# Patient Record
Sex: Female | Born: 1960 | Race: White | Hispanic: No | Marital: Married | Smoking: Never smoker
Health system: Southern US, Community
[De-identification: ages and names within clinical notes are randomized; demographics above are authoritative.]

## PROBLEM LIST (undated history)

## (undated) DIAGNOSIS — R000 Tachycardia, unspecified: Secondary | ICD-10-CM

## (undated) DIAGNOSIS — M1990 Unspecified osteoarthritis, unspecified site: Secondary | ICD-10-CM

## (undated) DIAGNOSIS — I82409 Acute embolism and thrombosis of unspecified deep veins of unspecified lower extremity: Secondary | ICD-10-CM

## (undated) DIAGNOSIS — J45909 Unspecified asthma, uncomplicated: Secondary | ICD-10-CM

## (undated) HISTORY — PX: ABDOMINAL HYSTERECTOMY: SHX81

## (undated) HISTORY — PX: BUNIONECTOMY: SHX129

## (undated) HISTORY — PX: TONSILLECTOMY: SUR1361

## (undated) HISTORY — PX: CHOLECYSTECTOMY: SHX55

---

## 2004-04-19 ENCOUNTER — Encounter: Admission: RE | Admit: 2004-04-19 | Discharge: 2004-04-19 | Payer: Self-pay | Admitting: Orthopedic Surgery

## 2010-11-16 ENCOUNTER — Encounter: Payer: Self-pay | Admitting: Orthopedic Surgery

## 2013-11-25 ENCOUNTER — Encounter (HOSPITAL_BASED_OUTPATIENT_CLINIC_OR_DEPARTMENT_OTHER): Payer: Self-pay | Admitting: Emergency Medicine

## 2013-11-25 ENCOUNTER — Emergency Department (HOSPITAL_BASED_OUTPATIENT_CLINIC_OR_DEPARTMENT_OTHER)
Admission: EM | Admit: 2013-11-25 | Discharge: 2013-11-25 | Disposition: A | Discharge status: Home / Self Care | Payer: Commercial Managed Care - PPO | Attending: Emergency Medicine | Admitting: Emergency Medicine

## 2013-11-25 ENCOUNTER — Emergency Department (HOSPITAL_BASED_OUTPATIENT_CLINIC_OR_DEPARTMENT_OTHER): Payer: Commercial Managed Care - PPO

## 2013-11-25 DIAGNOSIS — Y939 Activity, unspecified: Secondary | ICD-10-CM | POA: Insufficient documentation

## 2013-11-25 DIAGNOSIS — Y929 Unspecified place or not applicable: Secondary | ICD-10-CM | POA: Insufficient documentation

## 2013-11-25 DIAGNOSIS — Z79899 Other long term (current) drug therapy: Secondary | ICD-10-CM | POA: Insufficient documentation

## 2013-11-25 DIAGNOSIS — W230XXA Caught, crushed, jammed, or pinched between moving objects, initial encounter: Secondary | ICD-10-CM | POA: Insufficient documentation

## 2013-11-25 DIAGNOSIS — J45909 Unspecified asthma, uncomplicated: Secondary | ICD-10-CM | POA: Insufficient documentation

## 2013-11-25 DIAGNOSIS — S99921A Unspecified injury of right foot, initial encounter: Secondary | ICD-10-CM

## 2013-11-25 DIAGNOSIS — S99919A Unspecified injury of unspecified ankle, initial encounter: Principal | ICD-10-CM

## 2013-11-25 DIAGNOSIS — S8990XA Unspecified injury of unspecified lower leg, initial encounter: Secondary | ICD-10-CM | POA: Insufficient documentation

## 2013-11-25 DIAGNOSIS — Z86718 Personal history of other venous thrombosis and embolism: Secondary | ICD-10-CM | POA: Insufficient documentation

## 2013-11-25 DIAGNOSIS — S99929A Unspecified injury of unspecified foot, initial encounter: Principal | ICD-10-CM

## 2013-11-25 DIAGNOSIS — Z7901 Long term (current) use of anticoagulants: Secondary | ICD-10-CM | POA: Insufficient documentation

## 2013-11-25 HISTORY — DX: Tachycardia, unspecified: R00.0

## 2013-11-25 HISTORY — DX: Unspecified asthma, uncomplicated: J45.909

## 2013-11-25 HISTORY — DX: Acute embolism and thrombosis of unspecified deep veins of unspecified lower extremity: I82.409

## 2013-11-25 MED ORDER — ACETAMINOPHEN-CODEINE #3 300-30 MG PO TABS: 2.0000 | ORAL_TABLET | Freq: Four times a day (QID) | ORAL | Status: DC | PRN

## 2013-11-25 MED ORDER — CEPHALEXIN 500 MG PO CAPS: 500.0000 mg | ORAL_CAPSULE | Freq: Four times a day (QID) | ORAL | Status: DC

## 2013-11-25 NOTE — ED Provider Notes (Signed)
CSN: [id number]     Arrival date & time 11/25/13  1331 History   First MD Initiated Contact with Patient 11/25/13 1353     Chief Complaint  Patient presents with  . Foot Injury   (Consider location/radiation/quality/duration/timing/severity/associated sxs/prior Treatment) Patient is a 53 y.o. female presenting with foot injury. The history is provided by the patient. No language interpreter was used.  Foot Injury Location:  Foot Foot location:  R foot Associated symptoms: no fever   Associated symptoms comment:  Right foot injury after the bed rail of her bed broke causing the nail of the great toe to lift and bleed. No other injury.   Past Medical History  Diagnosis Date  . DVT (deep venous thrombosis)   . Asthma   . Tachycardia    History reviewed. No pertinent past surgical history. No family history on file. History  Substance Use Topics  . Smoking status: Never Smoker   . Smokeless tobacco: Not on file  . Alcohol Use: Not on file   OB History   Grav Para Term Preterm Abortions TAB SAB Ect Mult Living                 Review of Systems  Constitutional: Negative for fever and chills.  Musculoskeletal:       See HPI.  Skin: Negative.   Neurological: Negative.  Negative for numbness.    Allergies  Hydrocodone-acetaminophen and Oxycodone  Home Medications   Current Outpatient Rx  Name  Route  Sig  Dispense  Refill  . ALPRAZolam (XANAX) 0.5 MG tablet   Oral   Take 0.5 mg by mouth 3 (three) times daily as needed for anxiety.         . fexofenadine (ALLEGRA) 180 MG tablet   Oral   Take 180 mg by mouth daily.         . sertraline (ZOLOFT) 50 MG tablet   Oral   Take 50 mg by mouth daily.         . verapamil (VERELAN PM) 240 MG 24 hr capsule   Oral   Take 240 mg by mouth at bedtime.         Marland Kitchen. warfarin (COUMADIN) 10 MG tablet   Oral   Take 9 mg by mouth daily. 9mg  tues, thurs, sat 6mg  all the other days of the week          BP 174/77  Pulse  92  Temp(Src) 98.2 F (36.8 C) (Oral)  Resp 21  Ht 5\' 5"  (1.651 m)  Wt 360 lb (163.295 kg)  BMI 59.91 kg/m2  SpO2 98% Physical Exam  Constitutional: She is oriented to person, place, and time. She appears well-developed and well-nourished.  Neck: Normal range of motion.  Pulmonary/Chest: Effort normal.  Musculoskeletal:  Right great toe has dried blood surrounding the lateral nail. Nail itself is minimally avulsed. No subungual hematoma. No bony deformity. Old surgical scars over 1st MTP joint.   Neurological: She is alert and oriented to person, place, and time.  Skin: Skin is warm and dry.    ED Course  Procedures (including critical care time) Labs Review Labs Reviewed - No data to display Imaging Review No results found.  EKG Interpretation   None      Dg Toe Great Right  11/25/2013   CLINICAL DATA:  Injury to right great toe today, subungual hematoma, previous surgery 1999  EXAM: RIGHT GREAT TOE  COMPARISON:  None  FINDINGS: Two wires present at  distal first metatarsal post osteotomy and bunionectomy.  Osseous mineralization normal.  Dorsal soft tissue swelling overlying the distal metatarsals on lateral view.  Joint spaces preserved.  Nondisplaced fracture at tuft of distal phalanx along the anterior margin.  No additional fracture, dislocation or bone destruction.  IMPRESSION: Nondisplaced fracture at anterior margin, tuft of distal phalanx great toe.   Electronically Signed   By: Ulyses Southward M.D.   On: 11/25/2013 14:41   MDM  No diagnosis found. 1. Right great toe injury  Follow up with your podiatrist for further management of toe injury. Take medications as prescribed. Return to the emergency department with any severe pain or high fever.     Arnoldo Hooker, PA-C 11/25/13 1625

## 2013-11-25 NOTE — Discharge Instructions (Signed)
Nail Avulsion Injury Nail avulsion means that you have lost the whole, or part of a nail. The nail will usually grow back in 2 to 6 months. If your injury damaged the growth center of the nail, the nail may be deformed, split, or not stuck to the nail bed. Sometimes the avulsed nail is stitched back in place. This provides temporary protection to the nail bed until the new nail grows in.  HOME CARE INSTRUCTIONS   Raise (elevate) your injury as much as possible.  Protect the injury and cover it with bandages (dressings) or splints as instructed.  Change dressings as instructed. SEEK MEDICAL CARE IF:   There is increasing pain, redness, or swelling.  You cannot move your fingers or toes. Document Released: 11/19/2004 Document Revised: 01/04/2012 Document Reviewed: 09/13/2009 [employer] Patient Information 2014 [employer].  

## 2013-11-25 NOTE — ED Notes (Signed)
Patient here with right great toe pain and right heel pain after foot got caught in bed rail that broke

## 2013-11-26 NOTE — ED Provider Notes (Signed)
History/physical exam/procedure(s) were performed by non-physician practitioner and as supervising physician I was immediately available for consultation/collaboration. I have reviewed all notes and am in agreement with care and plan.   Hilario Quarryanielle S Ashvik Grundman, MD 11/26/13 [phone]

## 2013-11-27 NOTE — ED Notes (Signed)
Chart review.

## 2015-03-22 ENCOUNTER — Emergency Department (HOSPITAL_BASED_OUTPATIENT_CLINIC_OR_DEPARTMENT_OTHER): Payer: Commercial Managed Care - PPO

## 2015-03-22 ENCOUNTER — Emergency Department (HOSPITAL_BASED_OUTPATIENT_CLINIC_OR_DEPARTMENT_OTHER)
Admission: EM | Admit: 2015-03-22 | Discharge: 2015-03-22 | Disposition: A | Discharge status: Home / Self Care | Payer: Commercial Managed Care - PPO | Attending: Emergency Medicine | Admitting: Emergency Medicine

## 2015-03-22 ENCOUNTER — Encounter (HOSPITAL_BASED_OUTPATIENT_CLINIC_OR_DEPARTMENT_OTHER): Payer: Self-pay

## 2015-03-22 DIAGNOSIS — S8992XA Unspecified injury of left lower leg, initial encounter: Secondary | ICD-10-CM | POA: Insufficient documentation

## 2015-03-22 DIAGNOSIS — M25562 Pain in left knee: Secondary | ICD-10-CM

## 2015-03-22 DIAGNOSIS — Y9389 Activity, other specified: Secondary | ICD-10-CM | POA: Diagnosis not present

## 2015-03-22 DIAGNOSIS — Z79899 Other long term (current) drug therapy: Secondary | ICD-10-CM | POA: Diagnosis not present

## 2015-03-22 DIAGNOSIS — J45909 Unspecified asthma, uncomplicated: Secondary | ICD-10-CM | POA: Insufficient documentation

## 2015-03-22 DIAGNOSIS — Z8739 Personal history of other diseases of the musculoskeletal system and connective tissue: Secondary | ICD-10-CM | POA: Diagnosis not present

## 2015-03-22 DIAGNOSIS — Z86718 Personal history of other venous thrombosis and embolism: Secondary | ICD-10-CM | POA: Diagnosis not present

## 2015-03-22 DIAGNOSIS — Y9289 Other specified places as the place of occurrence of the external cause: Secondary | ICD-10-CM | POA: Diagnosis not present

## 2015-03-22 DIAGNOSIS — Z7901 Long term (current) use of anticoagulants: Secondary | ICD-10-CM | POA: Insufficient documentation

## 2015-03-22 DIAGNOSIS — Y998 Other external cause status: Secondary | ICD-10-CM | POA: Insufficient documentation

## 2015-03-22 DIAGNOSIS — X58XXXA Exposure to other specified factors, initial encounter: Secondary | ICD-10-CM | POA: Insufficient documentation

## 2015-03-22 DIAGNOSIS — M25569 Pain in unspecified knee: Secondary | ICD-10-CM

## 2015-03-22 HISTORY — DX: Unspecified osteoarthritis, unspecified site: M19.90

## 2015-03-22 MED ORDER — TRAMADOL HCL 50 MG PO TABS: 50.0000 mg | ORAL_TABLET | Freq: Four times a day (QID) | ORAL | Status: AC | PRN

## 2015-03-22 MED ORDER — TRAMADOL HCL 50 MG PO TABS
50.0000 mg | ORAL_TABLET | Freq: Once | ORAL | Status: AC
  Administered 2015-03-22: 50 mg via ORAL
  Filled 2015-03-22: qty 1

## 2015-03-22 NOTE — ED Provider Notes (Signed)
CSN: [id number]     Arrival date & time 03/22/15  1930 History   First MD Initiated Contact with Patient 03/22/15 1938     Chief Complaint  Patient presents with  . Knee Pain     (Consider location/radiation/quality/duration/timing/severity/associated sxs/prior Treatment) HPI Comments: 54 year old morbidly obese female complaining of left knee pain after "twisting it" while getting into the car this evening. Reports she heard a "snap" and immediately developed pain on the outside of her knee radiating towards the back. Pain described as severe and constant, worse with pressure and states she cannot walk on it. Reports history of knee issues, has arthritis in that knee and gets Synvisc injections, the last one was one week ago at [facility].  Patient is a 54 y.o. female presenting with knee pain. The history is provided by the patient.  Knee Pain   Past Medical History  Diagnosis Date  . DVT (deep venous thrombosis)   . Asthma   . Tachycardia   . Arthritis    Past Surgical History  Procedure Laterality Date  . Bunionectomy    . Abdominal hysterectomy    . Cesarean section    . Cholecystectomy    . Tonsillectomy     No family history on file. History  Substance Use Topics  . Smoking status: Never Smoker   . Smokeless tobacco: Not on file  . Alcohol Use: No   OB History    No data available     Review of Systems  Musculoskeletal:       + L knee pain.  Skin: Negative.   Neurological: Negative for numbness.      Allergies  Hydrocodone-acetaminophen and Oxycodone  Home Medications   Prior to Admission medications   Medication Sig Start Date End Date Taking? Authorizing Provider  ALPRAZolam [name]) 0.5 MG tablet Take 0.5 mg by mouth 3 (three) times daily as needed for anxiety.    Historical Provider, MD  fexofenadine (ALLEGRA) 180 MG tablet Take 180 mg by mouth daily.    Historical Provider, MD  sertraline (ZOLOFT) 50 MG tablet Take 50 mg by mouth daily.     Historical Provider, MD  traMADol (ULTRAM) 50 MG tablet Take 1 tablet (50 mg total) by mouth every 6 (six) hours as needed. 03/22/15   Kelsey Manning M Falisa Lamora, PA-C  verapamil (VERELAN PM) 240 MG 24 hr capsule Take 240 mg by mouth at bedtime.    Historical Provider, MD  warfarin (COUMADIN) 10 MG tablet Take 9 mg by mouth daily.  tues, thurs, sat  all the other days of the week    Historical Provider, MD   BP 147/74 mmHg  Pulse 80  Temp(Src) 99 F (37.2 C) (Oral)  Resp 18  Ht  (1.651 m)  Wt 395 lb (179.171 kg)  BMI 65.73 kg/m2  SpO2 92% Physical Exam  Constitutional: She is oriented to person, place, and time. She appears well-developed and well-nourished. No distress.  Morbidly obese.  HENT:  Head: Normocephalic and atraumatic.  Mouth/Throat: Oropharynx is clear and moist.  Eyes: Conjunctivae and EOM are normal.  Neck: Normal range of motion. Neck supple.  Cardiovascular: Normal rate, regular rhythm and normal heart sounds.   +2 PT/DP pulse on L.  Pulmonary/Chest: Effort normal and breath sounds normal. No respiratory distress.  Musculoskeletal: She exhibits no edema.  Exam limited by pt's body habitus. L knee TTP laterally. No swelling or deformity. Pain increased with flexion.  Neurological: She is alert and oriented to person,  place, and time. No sensory deficit.  Skin: Skin is warm and dry.  Psychiatric: She has a normal mood and affect. Her behavior is normal.  Nursing note and vitals reviewed.   ED Course  Procedures (including critical care time) Labs Review Labs Reviewed - No data to display  Imaging Review Dg Knee Complete 4 Views Left  03/22/2015   CLINICAL DATA:  Patient states she twisted Left knee getting into car tonight. Left knee pain. Unable to bear weight. hx of arthritis in left knee.  EXAM: LEFT KNEE - COMPLETE 4+ VIEW  COMPARISON:  None.  FINDINGS: No fracture. No dislocation. Knee joint normally spaced and aligned. Minor osteophytes noted from the  margins of the medial compartment and patella. No bone lesion.  No joint effusion.  There is some edema adjacent to the patellar tendon. The patellar tendon shadow is not well aligned. Consider patellar tendon injury if this correlates clinically. There is no elevation of the patella to suggest a rupture, however.  IMPRESSION: No fracture or dislocation.  Questionable patellar tendinopathy or injury. No evidence of disruption.   Electronically Signed   By: Amie Portlandavid  Ormond M.D.   On: 03/22/2015 20:22     EKG Interpretation None      MDM   Final diagnoses:  Left knee pain   Neurovascularly intact. X-ray showing no fracture or dislocation, questionable patellar tendinopathy or injury. No evidence of disruption. There is no obvious swelling on exam. No deformity. Exam limited by patient's body habitus. Reports she takes tramadol for pain and this tends to help. Will give Rx for tramadol. Advised patient that she will need to follow-up with her orthopedist. Placed a knee immobilizer. Prescription for a walker given. She has help this evening by both her son and husband. Stable for discharge. Return precautions given. Patient states understanding of treatment care plan and is agreeable.  Kelsey SpeedRobyn M Tinsley Everman, PA-C 03/22/15 2044  Gwyneth SproutWhitney Plunkett, MD 03/22/15 [phone]

## 2015-03-22 NOTE — Discharge Instructions (Signed)
Take tramadol as directed for your knee pain. Rest, elevate and apply ice to your knee. Use the walker for comfort. Follow up with your orthopedist.  Knee Pain The knee is the complex joint between your thigh and your lower leg. It is made up of bones, tendons, ligaments, and cartilage. The bones that make up the knee are:  The femur in the thigh.  The tibia and fibula in the lower leg.  The patella or kneecap riding in the groove on the lower femur. CAUSES  Knee pain is a common complaint with many causes. A few of these causes are:  Injury, such as:  A ruptured ligament or tendon injury.  Torn cartilage.  Medical conditions, such as:  Gout  Arthritis  Infections  Overuse, over training, or overdoing a physical activity. Knee pain can be minor or severe. Knee pain can accompany debilitating injury. Minor knee problems often respond well to self-care measures or get well on their own. More serious injuries may need medical intervention or even surgery. SYMPTOMS The knee is complex. Symptoms of knee problems can vary widely. Some of the problems are:  Pain with movement and weight bearing.  Swelling and tenderness.  Buckling of the knee.  Inability to straighten or extend your knee.  Your knee locks and you cannot straighten it.  Warmth and redness with pain and fever.  Deformity or dislocation of the kneecap. DIAGNOSIS  Determining what is wrong may be very straight forward such as when there is an injury. It can also be challenging because of the complexity of the knee. Tests to make a diagnosis may include:  Your caregiver taking a history and doing a physical exam.  Routine X-rays can be used to rule out other problems. X-rays will not reveal a cartilage tear. Some injuries of the knee can be diagnosed by:  Arthroscopy a surgical technique by which a small video camera is inserted through tiny incisions on the sides of the knee. This procedure is used to examine  and repair internal knee joint problems. Tiny instruments can be used during arthroscopy to repair the torn knee cartilage (meniscus).  Arthrography is a radiology technique. A contrast liquid is directly injected into the knee joint. Internal structures of the knee joint then become visible on X-ray film.  An MRI scan is a non X-ray radiology procedure in which magnetic fields and a computer produce two- or three-dimensional images of the inside of the knee. Cartilage tears are often visible using an MRI scanner. MRI scans have largely replaced arthrography in diagnosing cartilage tears of the knee.  Blood work.  Examination of the fluid that helps to lubricate the knee joint (synovial fluid). This is done by taking a sample out using a needle and a syringe. TREATMENT The treatment of knee problems depends on the cause. Some of these treatments are:  Depending on the injury, proper casting, splinting, surgery, or physical therapy care will be needed.  Give yourself adequate recovery time. Do not overuse your joints. If you begin to get sore during workout routines, back off. Slow down or do fewer repetitions.  For repetitive activities such as cycling or running, maintain your strength and nutrition.  Alternate muscle groups. For example, if you are a weight lifter, work the upper body on one day and the lower body the next.  Either tight or weak muscles do not give the proper support for your knee. Tight or weak muscles do not absorb the stress placed on the  knee joint. Keep the muscles surrounding the knee strong.  Take care of mechanical problems.  If you have flat feet, orthotics or special shoes may help. See your caregiver if you need help.  Arch supports, sometimes with wedges on the inner or outer aspect of the heel, can help. These can shift pressure away from the side of the knee most bothered by osteoarthritis.  A brace called an "unloader" brace also may be used to help ease  the pressure on the most arthritic side of the knee.  If your caregiver has prescribed crutches, braces, wraps or ice, use as directed. The acronym for this is PRICE. This means protection, rest, ice, compression, and elevation.  Nonsteroidal anti-inflammatory drugs (NSAIDs), can help relieve pain. But if taken immediately after an injury, they may actually increase swelling. Take NSAIDs with food in your stomach. Stop them if you develop stomach problems. Do not take these if you have a history of ulcers, stomach pain, or bleeding from the bowel. Do not take without your caregiver's approval if you have problems with fluid retention, heart failure, or kidney problems.  For ongoing knee problems, physical therapy may be helpful.  Glucosamine and chondroitin are over-the-counter dietary supplements. Both may help relieve the pain of osteoarthritis in the knee. These medicines are different from the usual anti-inflammatory drugs. Glucosamine may decrease the rate of cartilage destruction.  Injections of a corticosteroid drug into your knee joint may help reduce the symptoms of an arthritis flare-up. They may provide pain relief that lasts a few months. You may have to wait a few months between injections. The injections do have a small increased risk of infection, water retention, and elevated blood sugar levels.  Hyaluronic acid injected into damaged joints may ease pain and provide lubrication. These injections may work by reducing inflammation. A series of shots may give relief for as long as 6 months.  Topical painkillers. Applying certain ointments to your skin may help relieve the pain and stiffness of osteoarthritis. Ask your pharmacist for suggestions. Many over the-counter products are approved for temporary relief of arthritis pain.  In some countries, doctors often prescribe topical NSAIDs for relief of chronic conditions such as arthritis and tendinitis. A review of treatment with NSAID  creams found that they worked as well as oral medications but without the serious side effects. PREVENTION  Maintain a healthy weight. Extra pounds put more strain on your joints.  Get strong, stay limber. Weak muscles are a common cause of knee injuries. Stretching is important. Include flexibility exercises in your workouts.  Be smart about exercise. If you have osteoarthritis, chronic knee pain or recurring injuries, you may need to change the way you exercise. This does not mean you have to stop being active. If your knees ache after jogging or playing basketball, consider switching to swimming, water aerobics, or other low-impact activities, at least for a few days a week. Sometimes limiting high-impact activities will provide relief.  Make sure your shoes fit well. Choose footwear that is right for your sport.  Protect your knees. Use the proper gear for knee-sensitive activities. Use kneepads when playing volleyball or laying carpet. Buckle your seat belt every time you drive. Most shattered kneecaps occur in car accidents.  Rest when you are tired. SEEK MEDICAL CARE IF:  You have knee pain that is continual and does not seem to be getting better.  SEEK IMMEDIATE MEDICAL CARE IF:  Your knee joint feels hot to the touch and you  have a high fever. MAKE SURE YOU:   Understand these instructions.  Will watch your condition.  Will get help right away if you are not doing well or get worse. Document Released: 08/09/2007 Document Revised: 01/04/2012 Document Reviewed: 08/09/2007 [facility] Patient Information 2015 [place], [place]. This information is not intended to replace advice given to you by your health care provider. Make sure you discuss any questions you have with your health care provider.

## 2015-03-22 NOTE — ED Notes (Signed)
Left knee pain-hx of arthritis with "synvisk" injection last week-twisted left knee tonight getting into car

## 2016-12-25 IMAGING — DX DG KNEE COMPLETE 4+V*L*
4 series · 4 of 4 positions shown · non-contrast
Comparison: None.

CLINICAL DATA: Patient states she twisted Left knee getting into
car tonight. Left knee pain. Unable to bear weight. hx of arthritis
in left knee.

EXAM:
LEFT KNEE - COMPLETE 4+ VIEW

[knee ap]
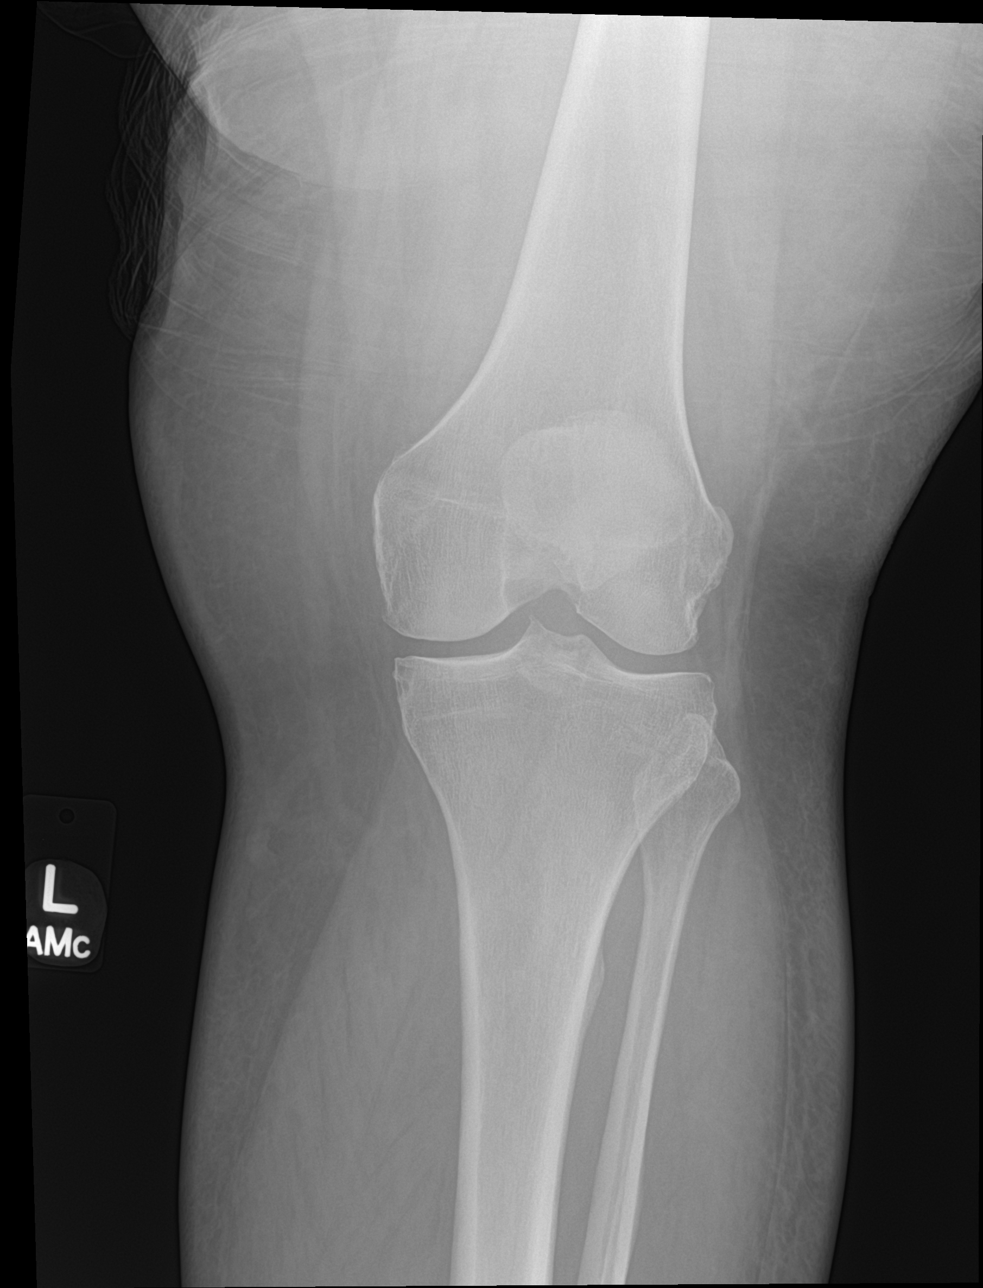

[knee lat]
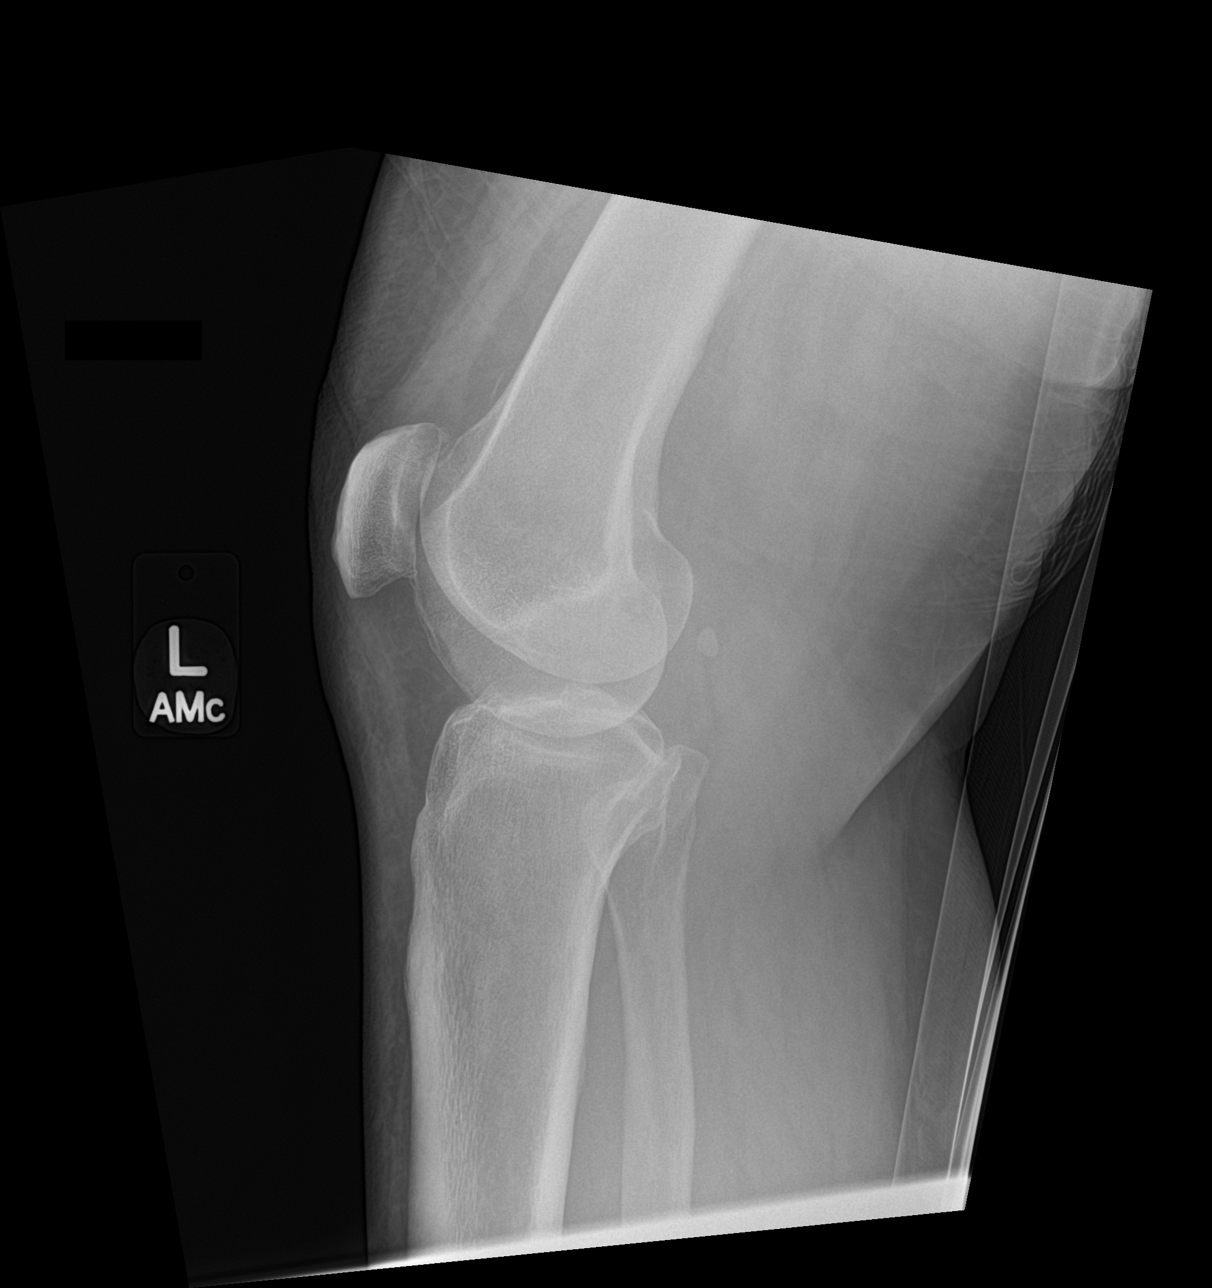

[knee obl (1 of 2)]
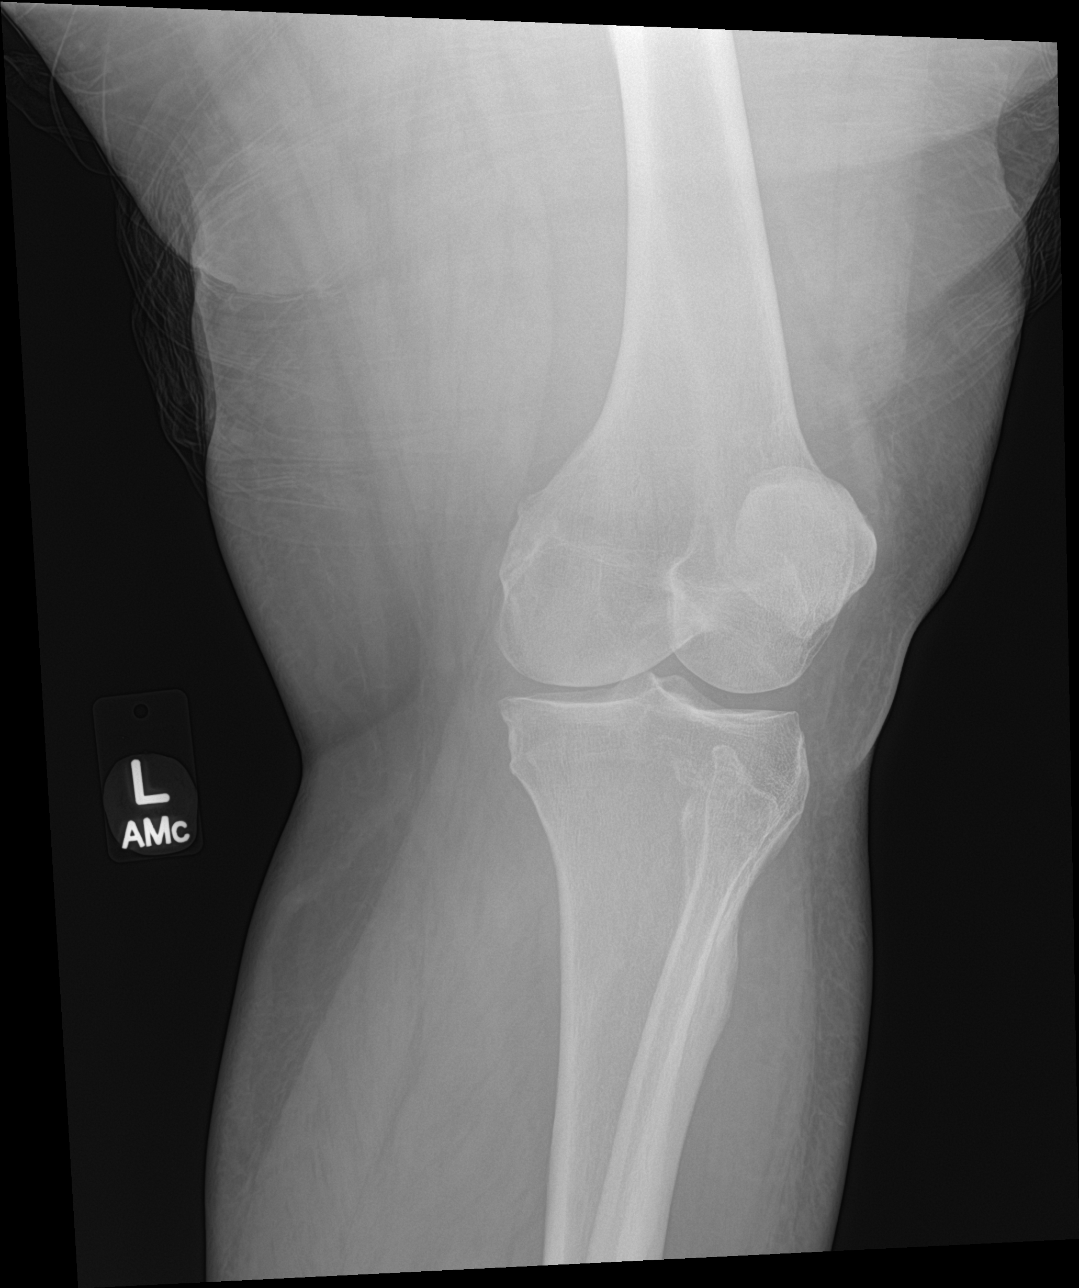

[knee obl (2 of 2)]
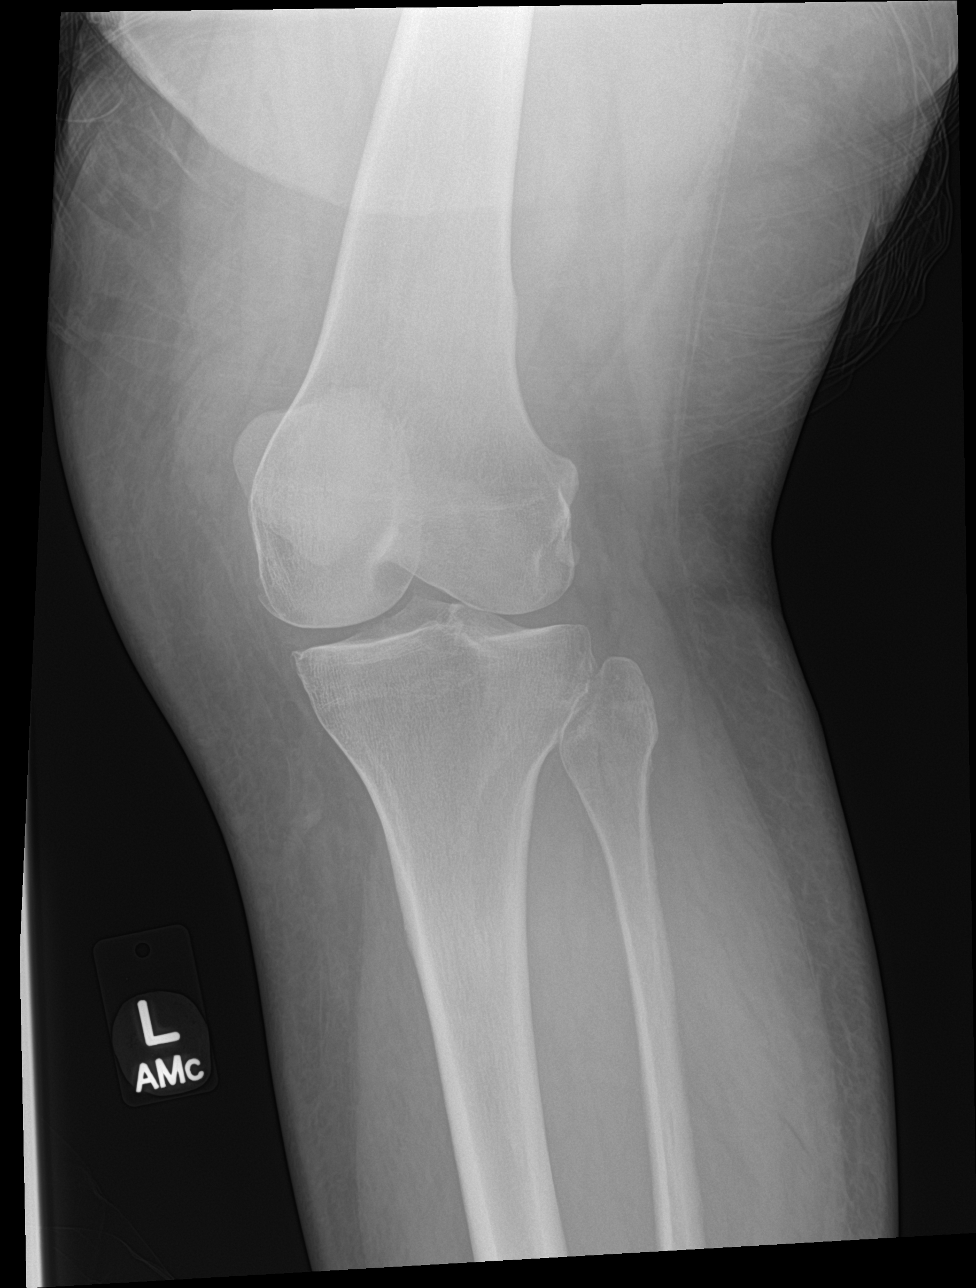

[4 of 4 positions shown; findings below may reference images not displayed]

FINDINGS: No fracture. No dislocation. Knee joint normally spaced and aligned.
Minor osteophytes noted from the margins of the medial compartment
and patella. No bone lesion.

No joint effusion.

There is some edema adjacent to the patellar tendon. The patellar
tendon shadow is not well aligned. Consider patellar tendon injury
if this correlates clinically. There is no elevation of the patella
to suggest a rupture, however.
IMPRESSION: No fracture or dislocation.

Questionable patellar tendinopathy or injury. No evidence of
disruption.
